# Patient Record
Sex: Female | Born: 1985 | Race: Black or African American | Hispanic: No | Marital: Single | Smoking: Current every day smoker
Health system: Southern US, Community
[De-identification: ages and names within clinical notes are randomized; demographics above are authoritative.]

---

## 2006-08-17 ENCOUNTER — Ambulatory Visit (HOSPITAL_COMMUNITY): Admission: RE | Admit: 2006-08-17 | Discharge: 2006-08-17 | Payer: Self-pay | Admitting: Obstetrics and Gynecology

## 2006-11-16 ENCOUNTER — Ambulatory Visit (HOSPITAL_COMMUNITY): Admission: RE | Admit: 2006-11-16 | Discharge: 2006-11-16 | Payer: Self-pay | Admitting: Family Medicine

## 2006-12-10 ENCOUNTER — Ambulatory Visit (HOSPITAL_COMMUNITY): Admission: RE | Admit: 2006-12-10 | Discharge: 2006-12-10 | Payer: Self-pay | Admitting: Obstetrics & Gynecology

## 2006-12-20 ENCOUNTER — Ambulatory Visit: Payer: Self-pay | Admitting: Obstetrics & Gynecology

## 2006-12-27 ENCOUNTER — Ambulatory Visit: Payer: Self-pay | Admitting: *Deleted

## 2006-12-30 ENCOUNTER — Ambulatory Visit: Payer: Self-pay | Admitting: Obstetrics and Gynecology

## 2006-12-30 ENCOUNTER — Ambulatory Visit (HOSPITAL_COMMUNITY): Admission: RE | Admit: 2006-12-30 | Discharge: 2006-12-30 | Payer: Self-pay | Admitting: Obstetrics and Gynecology

## 2007-01-03 ENCOUNTER — Ambulatory Visit: Payer: Self-pay | Admitting: Obstetrics & Gynecology

## 2007-01-07 ENCOUNTER — Inpatient Hospital Stay (HOSPITAL_COMMUNITY): Admission: AD | Admit: 2007-01-07 | Discharge: 2007-01-07 | Payer: Self-pay | Admitting: Gynecology

## 2007-01-09 ENCOUNTER — Ambulatory Visit: Payer: Self-pay | Admitting: *Deleted

## 2007-01-09 ENCOUNTER — Inpatient Hospital Stay (HOSPITAL_COMMUNITY): Admission: AD | Admit: 2007-01-09 | Discharge: 2007-01-11 | Payer: Self-pay | Admitting: Obstetrics & Gynecology

## 2007-01-09 ENCOUNTER — Encounter: Payer: Self-pay | Admitting: Obstetrics & Gynecology

## 2009-01-21 IMAGING — US US OB FOLLOW-UP
1 series · 14 of 28 positions shown · non-contrast
Comparison: none

OBSTETRICAL ULTRASOUND:

 This ultrasound exam was performed in the [HOSPITAL] Ultrasound Department.  The OB US report was generated in the AS system, and faxed to the ordering physician.  This report is also available in [REDACTED] PACS.

[Series 1: us ob follow-up · 0.33mm/px · 14 of 28 slices shown]
[im 2/28]
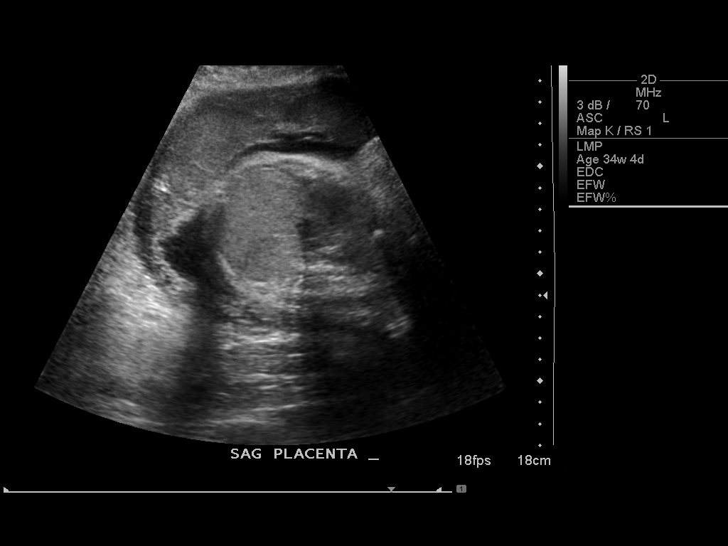
[im 4/28]
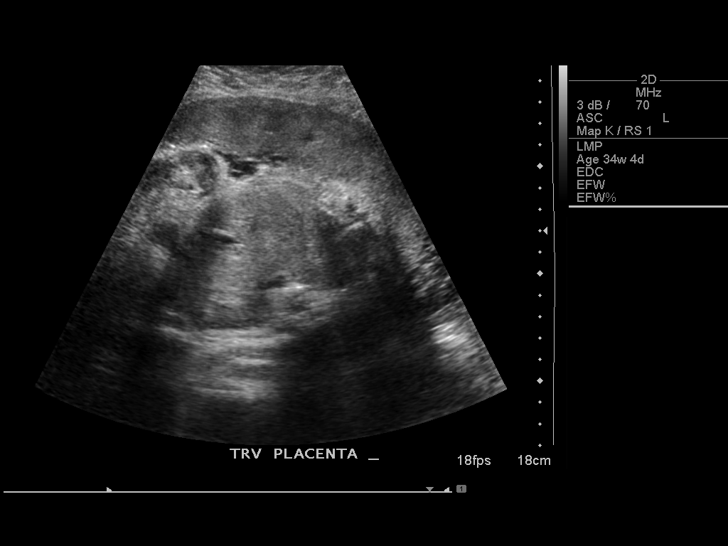
[im 6/28]
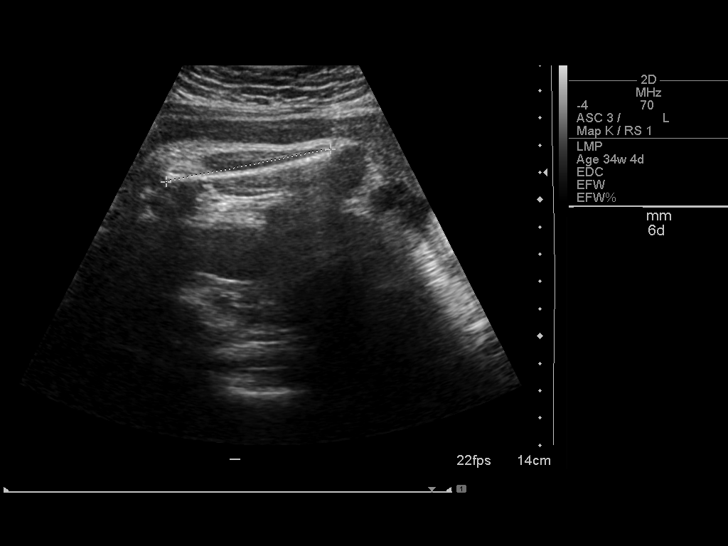
[im 8/28]
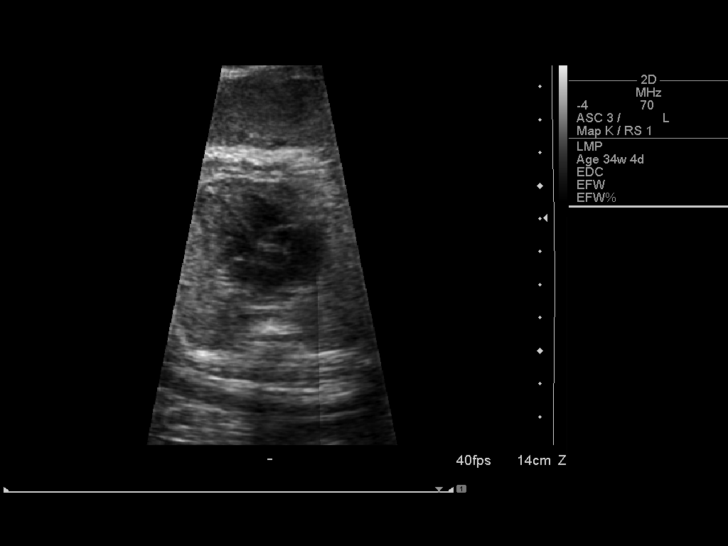
[im 10/28]
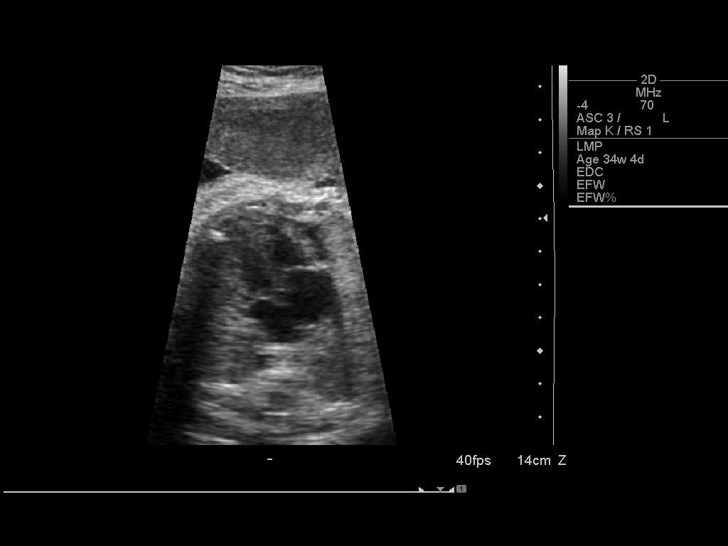
[im 12/28]
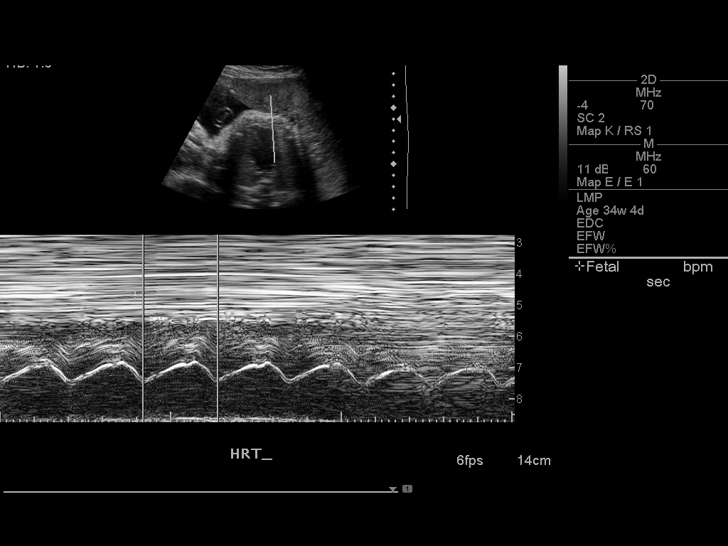
[im 14/28]
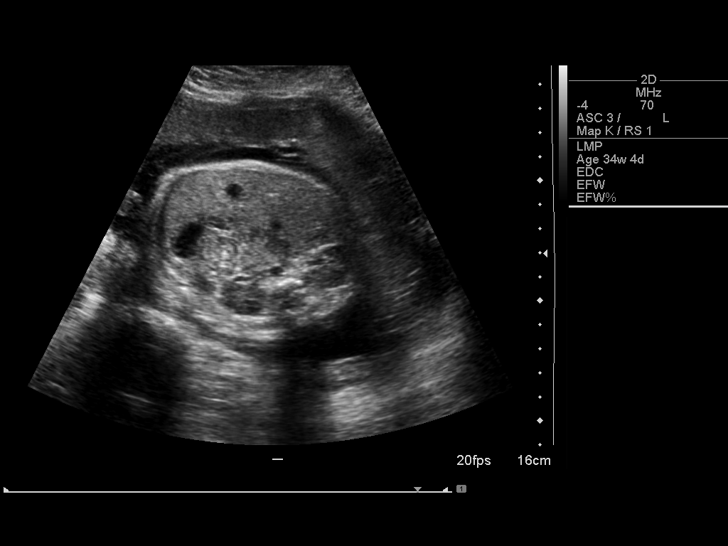
[im 16/28]
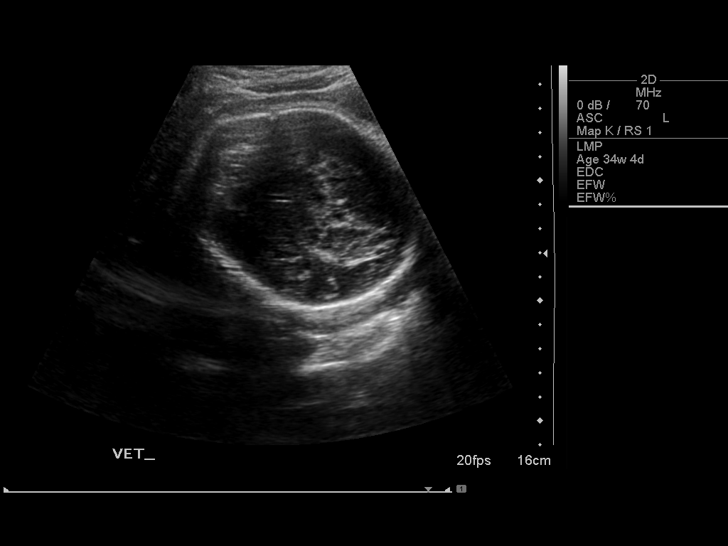
[im 18/28]
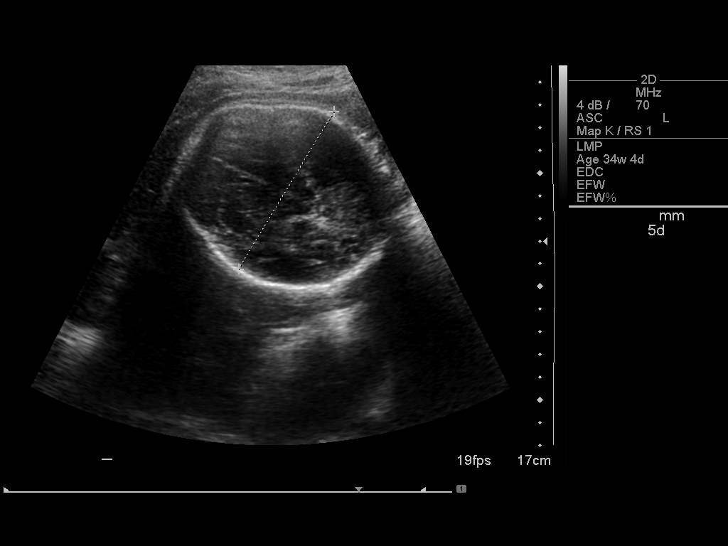
[im 20/28]
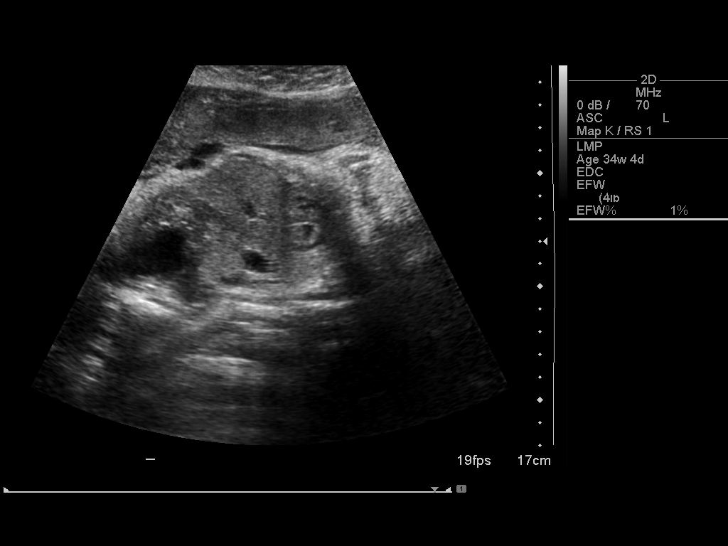
[im 22/28]
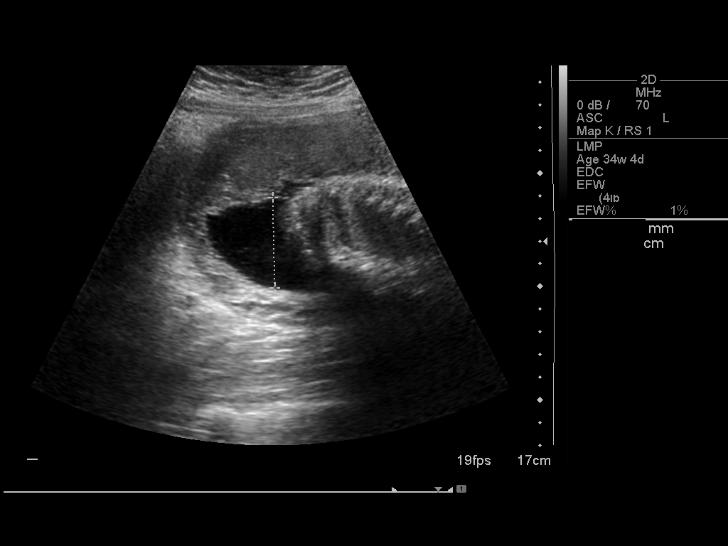
[im 24/28]
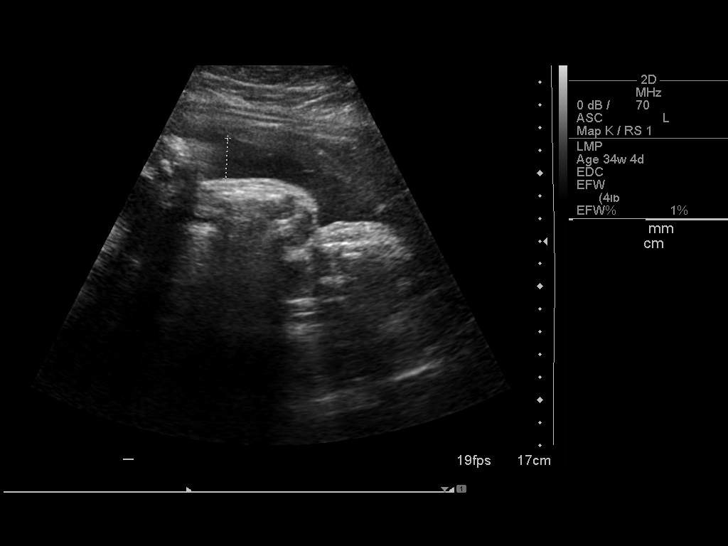
[im 26/28]
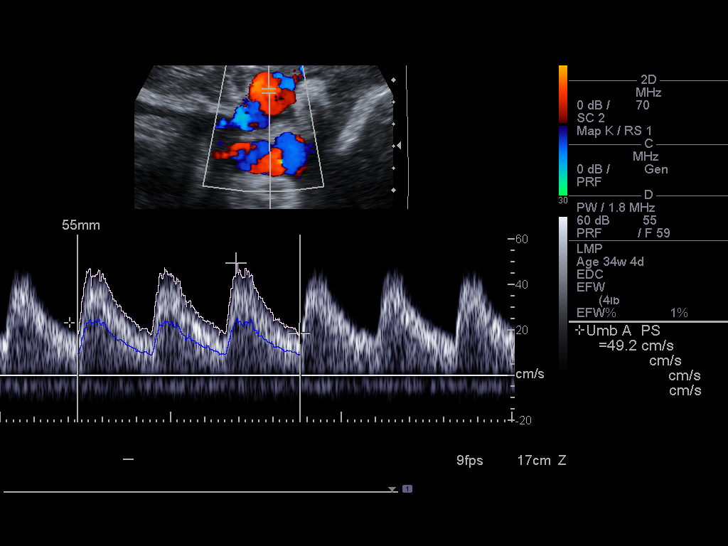
[im 28/28]
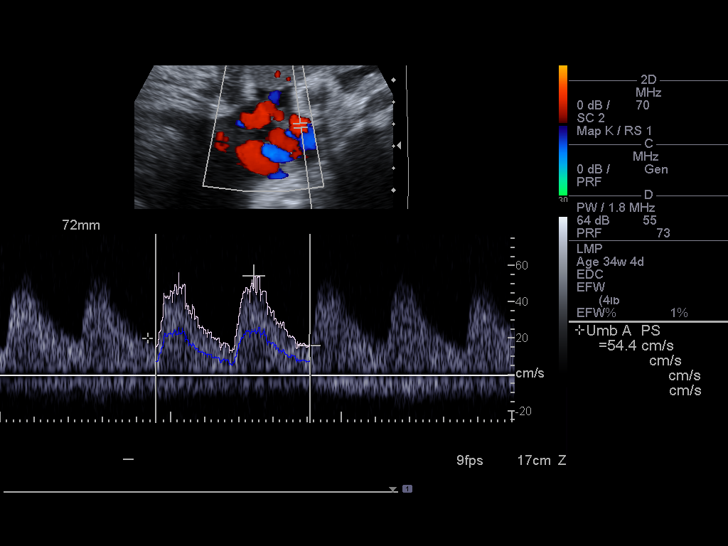

[14 of 28 positions shown; findings below may reference images not displayed]

IMPRESSION: See AS Obstetric US report.

## 2009-02-10 IMAGING — US US OB FOLLOW-UP
1 series · 4 of 4 positions shown · non-contrast
Comparison: none

OBSTETRICAL ULTRASOUND:

 This ultrasound exam was performed in the [HOSPITAL] Ultrasound Department.  The OB US report was generated in the AS system, and faxed to the ordering physician.  This report is also available in [REDACTED] PACS.

[Series 1: us ob follow-up · 4 of 4 slices shown]
[im 1/4]
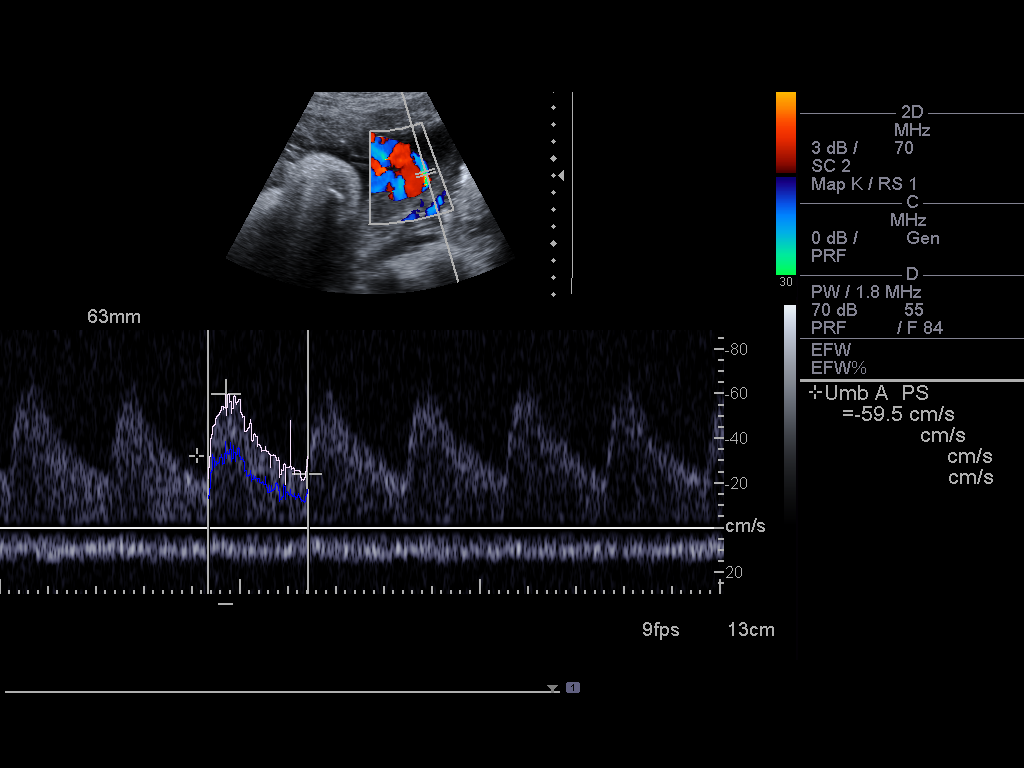
[im 2/4]
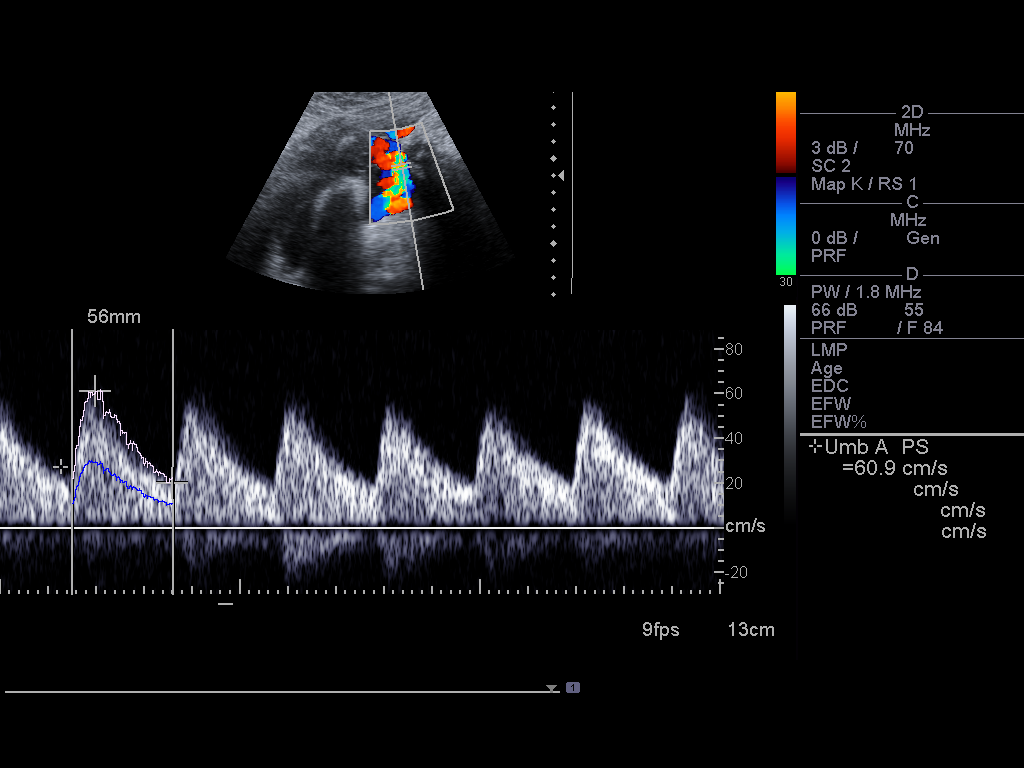
[im 3/4]
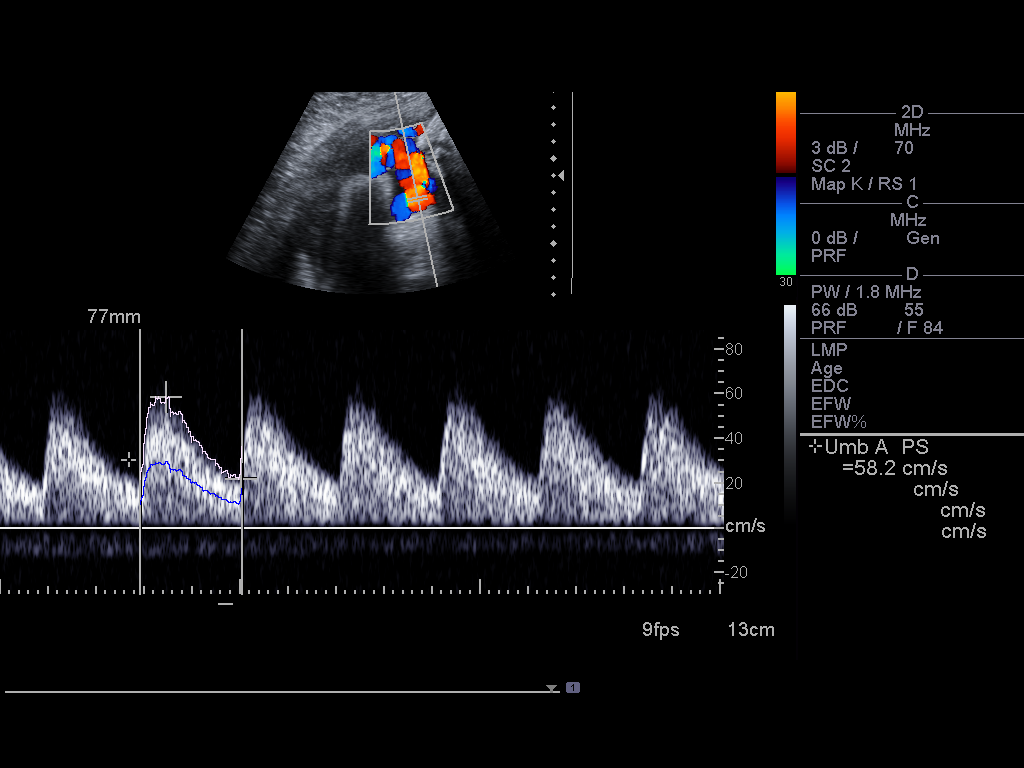
[im 4/4]
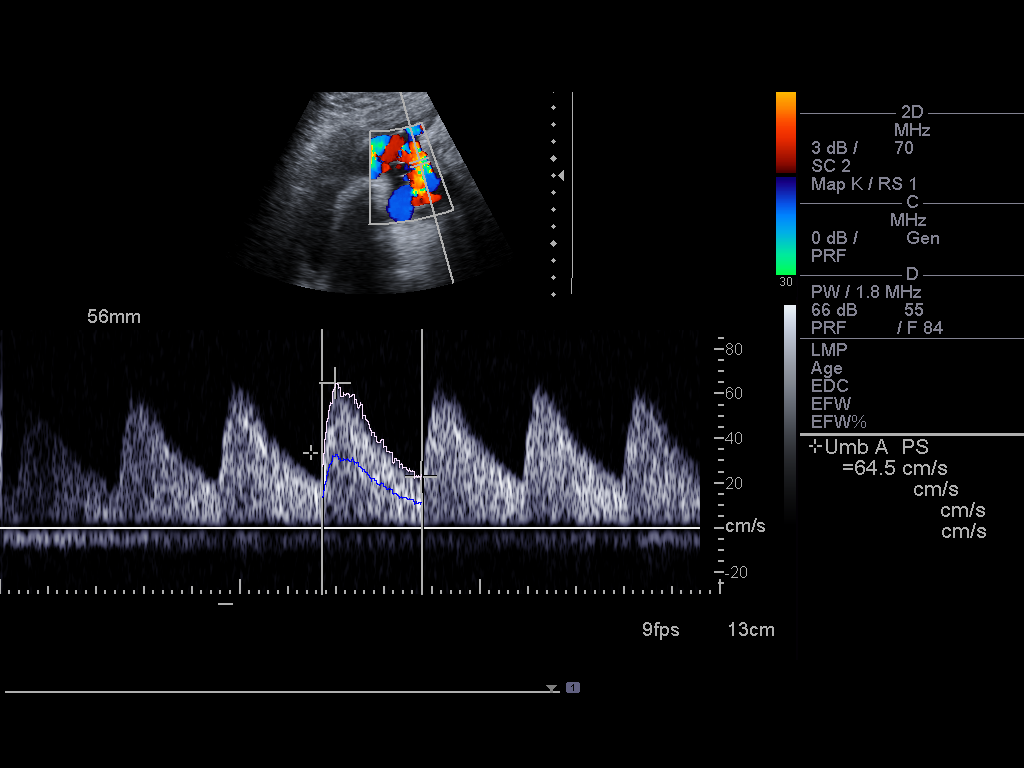

[4 of 4 positions shown; findings below may reference images not displayed]

IMPRESSION: See AS Obstetric US report.

## 2010-02-16 ENCOUNTER — Encounter: Payer: Self-pay | Admitting: *Deleted

## 2010-11-03 LAB — POCT URINALYSIS DIP (DEVICE)
Bilirubin Urine: NEGATIVE
Glucose, UA: NEGATIVE
Hgb urine dipstick: NEGATIVE
Ketones, ur: NEGATIVE
Nitrite: NEGATIVE
Operator id: 194561
Operator id: 194561
Protein, ur: NEGATIVE
Protein, ur: NEGATIVE
Specific Gravity, Urine: 1.015
Urobilinogen, UA: 0.2
Urobilinogen, UA: 0.2
pH: 7.5

## 2010-11-03 LAB — CBC
Platelets: 157
RBC: 4.38
WBC: 12.8 — ABNORMAL HIGH

## 2010-11-04 LAB — POCT URINALYSIS DIP (DEVICE)
Bilirubin Urine: NEGATIVE
Glucose, UA: NEGATIVE
Nitrite: NEGATIVE
Operator id: 194561
Protein, ur: 30 — AB
pH: 7.5

## 2012-08-22 ENCOUNTER — Encounter (HOSPITAL_BASED_OUTPATIENT_CLINIC_OR_DEPARTMENT_OTHER): Payer: Self-pay | Admitting: Emergency Medicine

## 2012-08-22 ENCOUNTER — Emergency Department (HOSPITAL_BASED_OUTPATIENT_CLINIC_OR_DEPARTMENT_OTHER): Payer: 59

## 2012-08-22 ENCOUNTER — Emergency Department (HOSPITAL_BASED_OUTPATIENT_CLINIC_OR_DEPARTMENT_OTHER)
Admission: EM | Admit: 2012-08-22 | Discharge: 2012-08-22 | Disposition: A | Discharge status: Home / Self Care | Payer: 59 | Attending: Emergency Medicine | Admitting: Emergency Medicine

## 2012-08-22 DIAGNOSIS — Z3202 Encounter for pregnancy test, result negative: Secondary | ICD-10-CM | POA: Insufficient documentation

## 2012-08-22 DIAGNOSIS — N390 Urinary tract infection, site not specified: Secondary | ICD-10-CM | POA: Insufficient documentation

## 2012-08-22 DIAGNOSIS — F17200 Nicotine dependence, unspecified, uncomplicated: Secondary | ICD-10-CM | POA: Insufficient documentation

## 2012-08-22 DIAGNOSIS — R3915 Urgency of urination: Secondary | ICD-10-CM | POA: Insufficient documentation

## 2012-08-22 DIAGNOSIS — M545 Low back pain: Secondary | ICD-10-CM

## 2012-08-22 LAB — URINALYSIS, ROUTINE W REFLEX MICROSCOPIC
Bilirubin Urine: NEGATIVE
Nitrite: NEGATIVE
Specific Gravity, Urine: 1.038 — ABNORMAL HIGH (ref 1.005–1.030)
Urobilinogen, UA: 1 mg/dL (ref 0.0–1.0)

## 2012-08-22 LAB — BASIC METABOLIC PANEL
BUN: 16 mg/dL (ref 6–23)
CO2: 22 mEq/L (ref 19–32)
Calcium: 9.6 mg/dL (ref 8.4–10.5)
Chloride: 104 mEq/L (ref 96–112)
Creatinine, Ser: 0.8 mg/dL (ref 0.50–1.10)
Potassium: 3.6 mEq/L (ref 3.5–5.1)

## 2012-08-22 LAB — CBC
MCH: 33.1 pg (ref 26.0–34.0)
MCHC: 33.8 g/dL (ref 30.0–36.0)
MCV: 97.9 fL (ref 78.0–100.0)
Platelets: 223 10*3/uL (ref 150–400)
RBC: 3.84 MIL/uL — ABNORMAL LOW (ref 3.87–5.11)
RDW: 13.8 % (ref 11.5–15.5)
WBC: 9.9 10*3/uL (ref 4.0–10.5)

## 2012-08-22 LAB — PREGNANCY, URINE: Preg Test, Ur: NEGATIVE

## 2012-08-22 LAB — URINE MICROSCOPIC-ADD ON

## 2012-08-22 MED ORDER — OXYCODONE-ACETAMINOPHEN 5-325 MG PO TABS: 1.0000 | ORAL_TABLET | Freq: Four times a day (QID) | ORAL | Status: AC | PRN

## 2012-08-22 MED ORDER — IBUPROFEN 800 MG PO TABS
800.0000 mg | ORAL_TABLET | Freq: Once | ORAL | Status: AC
Start: 2012-08-22 — End: 2012-08-22
  Administered 2012-08-22: 800 mg via ORAL
  Filled 2012-08-22: qty 1

## 2012-08-22 MED ORDER — CEPHALEXIN 500 MG PO CAPS: 500.0000 mg | ORAL_CAPSULE | Freq: Four times a day (QID) | ORAL | Status: AC

## 2012-08-22 MED ORDER — DIAZEPAM 5 MG PO TABS
5.0000 mg | ORAL_TABLET | Freq: Once | ORAL | Status: DC
  Filled 2012-08-22: qty 1

## 2012-08-22 MED ORDER — IBUPROFEN 600 MG PO TABS: 600.0000 mg | ORAL_TABLET | Freq: Four times a day (QID) | ORAL | Status: AC | PRN

## 2012-08-22 MED ORDER — OXYCODONE-ACETAMINOPHEN 5-325 MG PO TABS
1.0000 | ORAL_TABLET | Freq: Once | ORAL | Status: DC
  Filled 2012-08-22 (×2): qty 1

## 2012-08-22 MED ORDER — CYCLOBENZAPRINE HCL 5 MG PO TABS: 5.0000 mg | ORAL_TABLET | Freq: Three times a day (TID) | ORAL | Status: AC | PRN

## 2012-08-22 NOTE — ED Notes (Signed)
rx x 4 given for keflex, percocet, ibuprofen and flexeril

## 2012-08-22 NOTE — ED Notes (Signed)
Pt reports lower left sided back pain that radiates across her back, pain comes and goes through out the day

## 2012-08-22 NOTE — ED Provider Notes (Signed)
CSN: [id number]     Arrival date & time 08/22/12  2037 History  This chart was scribed for Ethelda Chick, MD by Bennett Scrape, ED Scribe. This patient was seen in room MH08/MH08 and the patient's care was started at 9:35 PM.   First MD Initiated Contact with Patient 08/22/12 2125     Chief Complaint  Patient presents with  . Back Pain    Patient is a 27 y.o. female presenting with back pain. The history is provided by the patient. No language interpreter was used.  Back Pain Location:  Lumbar spine Quality:  Stiffness Stiffness is present:  In the morning Radiates to: right lumbar region. Onset quality:  Gradual Duration:  2 weeks Progression:  Worsening Chronicity:  New Context: not falling and not recent injury   Relieved by:  Being still Worsened by:  Sitting Associated symptoms: no bladder incontinence, no bowel incontinence, no dysuria and no fever     HPI Comments: Barbara Pena is a 27 y.o. female who presents to the Emergency Department complaining of left lower back pain that has been felt intermittently for the past 2 weeks but has become constant over the past 3 days. She reports that the pain starts off as a stiffness in the morning that improves with ambulation and movement. Throughout the day the pain is described as a brief sharp pain that is aggravated with sitting straight up and is usually felt with changes in positions. She denies having a h/o chronic back pain or prior episodes. She reports urinary urgency but denies fevers, dysuria and nausea as associated symptoms.  History reviewed. No pertinent past medical history.  History reviewed. No pertinent past surgical history.  History reviewed. No pertinent family history.  History  Substance Use Topics  . Smoking status: Current Every Day Smoker -- 0.50 packs/day    Types: Cigarettes  . Smokeless tobacco: Not on file  . Alcohol Use: Yes     Comment: ocassional   No OB history provided.  Review  of Systems  Constitutional: Negative for fever.  Gastrointestinal: Negative for nausea, vomiting and bowel incontinence.  Genitourinary: Positive for urgency. Negative for bladder incontinence and dysuria.  Musculoskeletal: Positive for back pain.  All other systems reviewed and are negative.    Allergies  Review of patient's allergies indicates no known allergies.  Home Medications   Current Outpatient Rx  Name  Route  Sig  Dispense  Refill  . cephALEXin (KEFLEX) 500 MG capsule   Oral   Take 1 capsule (500 mg total) by mouth 4 (four) times daily.   28 capsule   0   . cyclobenzaprine (FLEXERIL) 5 MG tablet   Oral   Take 1 tablet (5 mg total) by mouth 3 (three) times daily as needed for muscle spasms.   20 tablet   0   . ibuprofen (ADVIL,MOTRIN) 600 MG tablet   Oral   Take 1 tablet (600 mg total) by mouth every 6 (six) hours as needed for pain.   30 tablet   0   . oxyCODONE-acetaminophen (PERCOCET/ROXICET) 5-325 MG per tablet   Oral   Take 1-2 tablets by mouth every 6 (six) hours as needed for pain.   15 tablet   0     Triage Vitals: BP 154/95  Pulse 96  Temp(Src) 98.7 F (37.1 C) (Oral)  Resp 18  Ht 5\' 7"  (1.702 m)  Wt 197 lb (89.359 kg)  BMI 30.85 kg/m2  SpO2 100%  Physical  Exam  Nursing note and vitals reviewed. Constitutional: She is oriented to person, place, and time. She appears well-developed and well-nourished. No distress.  HENT:  Head: Normocephalic and atraumatic.  Eyes: Conjunctivae and EOM are normal.  Neck: Normal range of motion. Neck supple. No tracheal deviation present.  Cardiovascular: Normal rate and regular rhythm.   No murmur heard. Pulmonary/Chest: Effort normal and breath sounds normal. No respiratory distress. She has no wheezes.  Musculoskeletal: She exhibits tenderness.  Left lumbar paraspinal tenderness to palpation, no midline tenderness  Neurological: She is alert and oriented to person, place, and time.  Skin: Skin is  warm and dry.  Psychiatric: She has a normal mood and affect. Her behavior is normal.    ED Course   Procedures (including critical care time)  Medications  ibuprofen (ADVIL,MOTRIN) tablet 800 mg (800 mg Oral Given 08/22/12 2150)    DIAGNOSTIC STUDIES: Oxygen Saturation is 100% on room air, normal by my interpretation.    COORDINATION OF CARE: 9:41 PM-Discussed treatment plan which includes Ct of abdomen to rule out a kidney infection, CBC panel, BMP and medications with pt at bedside and pt agreed to plan. Informed pt of UA showing bladder infection. Will prescribe antibiotics for the bladder infection.  Labs Reviewed  URINALYSIS, ROUTINE W REFLEX MICROSCOPIC - Abnormal; Notable for the following:    APPearance CLOUDY (*)    Specific Gravity, Urine 1.038 (*)    Hgb urine dipstick MODERATE (*)    Ketones, ur 15 (*)    Leukocytes, UA MODERATE (*)    All other components within normal limits  URINE MICROSCOPIC-ADD ON - Abnormal; Notable for the following:    Squamous Epithelial / LPF FEW (*)    Bacteria, UA FEW (*)    All other components within normal limits  CBC - Abnormal; Notable for the following:    RBC 3.84 (*)    All other components within normal limits  URINE CULTURE  PREGNANCY, URINE  BASIC METABOLIC PANEL   Ct Abdomen Pelvis Wo Contrast  08/22/2012   *RADIOLOGY REPORT*  Clinical Data: Left flank and back pain.  CT ABDOMEN AND PELVIS WITHOUT CONTRAST  Technique:  Multidetector CT imaging of the abdomen and pelvis was performed following the standard protocol without intravenous contrast.  Comparison: None.  Findings: The kidneys have a normal appearance by unenhanced CT and show no evidence of obstruction or calculi.  The ureters and bladder are unremarkable.  Unenhanced appearance of the liver, gallbladder, pancreas, spleen, adrenal glands and bowel are unremarkable.  No free fluid or focal fluid collection is identified.  The uterus and adnexal regions are unremarkable  by CT.  No hernias, masses or enlarged lymph nodes are seen.  Bony structures are unremarkable.  IMPRESSION: Normal unenhanced CT of the abdomen and pelvis.   Original Report Authenticated By: Irish Lack, M.D.   1. Lower back pain   2. Urinary tract infection     MDM  Pt presenting with c/o low back pain.  Pain seems musculoskeletal in nature given positional worsening.  However she does have some RBCs and WBCs in urine- renal function normal.  CT scan obtained to r/o renal stone.  This was reassuring. Pt discharged with medication for UTI as well as muscular back pain.  Discharged with strict return precautions.  Pt agreeable with plan.  I personally performed the services described in this documentation, which was scribed in my presence. The recorded information has been reviewed and is accurate.  Ethelda Chick, MD 08/23/12 [phone]

## 2012-08-23 LAB — URINE CULTURE

## 2016-02-24 ENCOUNTER — Encounter (HOSPITAL_BASED_OUTPATIENT_CLINIC_OR_DEPARTMENT_OTHER): Payer: Self-pay | Admitting: Emergency Medicine

## 2016-02-24 ENCOUNTER — Emergency Department (HOSPITAL_BASED_OUTPATIENT_CLINIC_OR_DEPARTMENT_OTHER)
Admission: EM | Admit: 2016-02-24 | Discharge: 2016-02-25 | Disposition: A | Discharge status: Home / Self Care | Payer: 59 | Attending: Emergency Medicine | Admitting: Emergency Medicine

## 2016-02-24 DIAGNOSIS — N760 Acute vaginitis: Secondary | ICD-10-CM

## 2016-02-24 DIAGNOSIS — A599 Trichomoniasis, unspecified: Secondary | ICD-10-CM

## 2016-02-24 DIAGNOSIS — R300 Dysuria: Secondary | ICD-10-CM | POA: Diagnosis present

## 2016-02-24 DIAGNOSIS — B9689 Other specified bacterial agents as the cause of diseases classified elsewhere: Secondary | ICD-10-CM

## 2016-02-24 DIAGNOSIS — F17210 Nicotine dependence, cigarettes, uncomplicated: Secondary | ICD-10-CM | POA: Insufficient documentation

## 2016-02-24 DIAGNOSIS — Z791 Long term (current) use of non-steroidal anti-inflammatories (NSAID): Secondary | ICD-10-CM | POA: Diagnosis not present

## 2016-02-24 DIAGNOSIS — Z79899 Other long term (current) drug therapy: Secondary | ICD-10-CM | POA: Diagnosis not present

## 2016-02-24 DIAGNOSIS — A5901 Trichomonal vulvovaginitis: Secondary | ICD-10-CM | POA: Diagnosis not present

## 2016-02-24 LAB — WET PREP, GENITAL
CLUE CELLS WET PREP: NONE SEEN
Sperm: NONE SEEN
YEAST WET PREP: NONE SEEN

## 2016-02-24 LAB — URINALYSIS, ROUTINE W REFLEX MICROSCOPIC
BILIRUBIN URINE: NEGATIVE
Glucose, UA: NEGATIVE mg/dL
KETONES UR: NEGATIVE mg/dL
NITRITE: NEGATIVE
PH: 6 (ref 5.0–8.0)
PROTEIN: NEGATIVE mg/dL
Specific Gravity, Urine: 1.021 (ref 1.005–1.030)

## 2016-02-24 LAB — PREGNANCY, URINE: Preg Test, Ur: NEGATIVE

## 2016-02-24 LAB — URINALYSIS, MICROSCOPIC (REFLEX)

## 2016-02-24 MED ORDER — METRONIDAZOLE 500 MG PO TABS: 500.0000 mg | ORAL_TABLET | Freq: Two times a day (BID) | ORAL | 0 refills | Status: AC

## 2016-02-24 MED ORDER — HYDROCODONE-ACETAMINOPHEN 5-325 MG PO TABS: 2.0000 | ORAL_TABLET | ORAL | 0 refills | Status: AC | PRN

## 2016-02-24 NOTE — ED Notes (Signed)
Urinary frequency, urgency and bladder pressure, with chronic lower back pain.  Pt denies vaginal discharge.  She took one AZO tablet this morning and her symptoms got better.  Has not taken anymore since then.

## 2016-02-24 NOTE — ED Triage Notes (Addendum)
Bilateral flank pain x 4 days. Denies N/V, urinary symptoms. Pt took AZO today.

## 2016-02-24 NOTE — Discharge Instructions (Signed)
Return if any problems.

## 2016-02-24 NOTE — ED Provider Notes (Signed)
[facility]   CSN: [id number] Arrival date & time: 02/24/16  1918 By signing my name below, I, Bridgette HabermannMaria Tan, attest that this documentation has been prepared under the direction and in the presence of Langston MaskerKaren Stein [place], [place]-C. Electronically Signed: Bridgette HabermannMaria Tan, ED Scribe. 02/24/16. 10:53 PM.  History   Chief Complaint Chief Complaint  Patient presents with  . Dysuria    HPI The history is provided by the patient. No language interpreter was used.   HPI Comments: Barbara PortMiranda L Pena is a 31 y.o. female with no pertinent PMHx, who presents to the Emergency Department complaining of waxing and waning bilateral flank pain onset 4 days ago. Pt also has associated urinary frequency, urgency, vaginal itching, and a pressure sensation in her bladder. Her symptoms seem to improve after using the restroom. Pt took AZO today with minimal relief. Denies any new sexual partners. She further denies fever, chills, nausea, vomiting, dysuria, hematuria, vaginal discharge, or any other associated symptoms.   History reviewed. No pertinent past medical history.  There are no active problems to display for this patient.   History reviewed. No pertinent surgical history.  OB History    No data available       Home Medications    Prior to Admission medications   Medication Sig Start Date End Date Taking? Authorizing Provider  cephALEXin (KEFLEX) 500 MG capsule Take 1 capsule (500 mg total) by mouth 4 (four) times daily. 08/22/12   Jerelyn ScottMartha Linker, MD  cyclobenzaprine (FLEXERIL) 5 MG tablet Take 1 tablet (5 mg total) by mouth 3 (three) times daily as needed for muscle spasms. 08/22/12   Jerelyn ScottMartha Linker, MD  ibuprofen (ADVIL,MOTRIN) 600 MG tablet Take 1 tablet (600 mg total) by mouth every 6 (six) hours as needed for pain. 08/22/12   Jerelyn ScottMartha Linker, MD  oxyCODONE-acetaminophen (PERCOCET/ROXICET) 5-325 MG per tablet Take 1-2 tablets by mouth every 6 (six) hours as needed for pain. 08/22/12   Jerelyn ScottMartha  Linker, MD    Family History No family history on file.  Social History Social History  Substance Use Topics  . Smoking status: Current Every Day Smoker    Packs/day: 0.50    Types: Cigarettes  . Smokeless tobacco: Never Used  . Alcohol use Yes     Comment: ocassional     Allergies   Patient has no known allergies.   Review of Systems Review of Systems  Constitutional: Negative for chills and fever.  Gastrointestinal: Negative for nausea and vomiting.  Genitourinary: Positive for flank pain, frequency and urgency. Negative for dysuria, hematuria and vaginal discharge.  All other systems reviewed and are negative.    Physical Exam Updated Vital Signs BP (!) 159/107 (BP Location: Left Arm)   Pulse 99   Temp 98.1 F (36.7 C) (Oral)   Resp 20   Ht 5\' 7"  (1.702 m)   Wt 210 lb (95.3 kg)   SpO2 100%   BMI 32.89 kg/m   Physical Exam  Constitutional: She appears well-developed and well-nourished.  HENT:  Head: Normocephalic.  Eyes: Conjunctivae are normal.  Cardiovascular: Normal rate.   Pulmonary/Chest: Effort normal. No respiratory distress.  Abdominal: She exhibits no distension.  Genitourinary: Cervix exhibits no motion tenderness. Right adnexum displays no tenderness. Left adnexum displays no tenderness. No tenderness in the vagina. Vaginal discharge found.  Genitourinary Comments: Chaperone present throughout entire exam. Thick yellowish vaginal discharge.  Musculoskeletal: Normal range of motion.  Neurological: She is alert.  Skin: Skin is warm and dry.  Psychiatric: She has a normal mood and affect. Her behavior is normal.  Nursing note and vitals reviewed.  ED Treatments / Results  DIAGNOSTIC STUDIES: Oxygen Saturation is 100% on RA, normal by my interpretation.    COORDINATION OF CARE: 10:52 PM Discussed treatment plan with pt at bedside which includes Rx antibiotics and pt agreed to plan.  Labs (all labs ordered are listed, but only abnormal  results are displayed) Labs Reviewed  URINALYSIS, ROUTINE W REFLEX MICROSCOPIC - Abnormal; Notable for the following:       Result Value   Hgb urine dipstick TRACE (*)    Leukocytes, UA MODERATE (*)    All other components within normal limits  URINALYSIS, MICROSCOPIC (REFLEX) - Abnormal; Notable for the following:    Bacteria, UA RARE (*)    Squamous Epithelial / LPF 0-5 (*)    All other components within normal limits  PREGNANCY, URINE    EKG  EKG Interpretation None       Radiology No results found.  Procedures Procedures (including critical care time)  Medications Ordered in ED Medications - No data to display   Initial Impression / Assessment and Plan / ED Course  I have reviewed the triage vital signs and the nursing notes.  Pertinent labs & imaging results that were available during my care of the patient were reviewed by me and considered in my medical decision making (see chart for details).       Final Clinical Impressions(s) / ED Diagnoses   Final diagnoses:  BV (bacterial vaginosis)  Trichomonosis    New Prescriptions New Prescriptions   No medications on file   Meds ordered this encounter  Medications  . metroNIDAZOLE (FLAGYL) 500 MG tablet    Sig: Take 1 tablet (500 mg total) by mouth 2 (two) times daily.    Dispense:  14 tablet    Refill:  0    Order Specific Question:   Supervising Provider    Answer:   MILLER, BRIAN [[id number]]  . HYDROcodone-acetaminophen (NORCO/VICODIN) 5-325 MG tablet    Sig: Take 2 tablets by mouth every 4 (four) hours as needed.    Dispense:  10 tablet    Refill:  0    Order Specific Question:   Supervising Provider    Answer:   Eber Hong [[id number]]  An After Visit Summary was printed and given to the patient. I personally performed the services in this documentation, which was scribed in my presence.  The recorded information has been reviewed and considered.   Barnet Pall.    Lonia Skinner [place], PA-C 02/25/16  1610    Vanetta Mulders, MD 02/27/16 [phone]

## 2016-02-25 LAB — GC/CHLAMYDIA PROBE AMP (~~LOC~~) NOT AT ARMC
Chlamydia: NEGATIVE
NEISSERIA GONORRHEA: NEGATIVE

## 2016-02-25 NOTE — ED Notes (Signed)
Pt verbalizes understanding of d/c instructions and denies any further needs at this time.
